# Patient Record
Sex: Male | Born: 1935 | Race: White | Hispanic: No | Marital: Married
Health system: Midwestern US, Academic
[De-identification: ages and names within clinical notes are randomized; demographics above are authoritative.]

---

## 2017-09-25 ENCOUNTER — Encounter: Admit: 2017-09-25 | Discharge: 2017-09-25 | Payer: MEDICARE

## 2017-09-25 ENCOUNTER — Ambulatory Visit: Admit: 2017-09-25 | Discharge: 2017-09-26 | Payer: MEDICARE

## 2017-09-25 DIAGNOSIS — E7800 Pure hypercholesterolemia, unspecified: ICD-10-CM

## 2017-09-25 DIAGNOSIS — I480 Paroxysmal atrial fibrillation: ICD-10-CM

## 2017-09-25 DIAGNOSIS — I4892 Unspecified atrial flutter: ICD-10-CM

## 2017-09-25 DIAGNOSIS — Z7901 Long term (current) use of anticoagulants: ICD-10-CM

## 2017-09-25 DIAGNOSIS — N200 Calculus of kidney: ICD-10-CM

## 2017-10-09 ENCOUNTER — Ambulatory Visit: Admit: 2017-10-09 | Discharge: 2017-10-10 | Payer: MEDICARE

## 2017-10-09 ENCOUNTER — Encounter: Admit: 2017-10-09 | Discharge: 2017-10-09 | Payer: MEDICARE

## 2017-10-09 DIAGNOSIS — I4892 Unspecified atrial flutter: Principal | ICD-10-CM

## 2017-10-09 DIAGNOSIS — E7800 Pure hypercholesterolemia, unspecified: ICD-10-CM

## 2017-10-09 DIAGNOSIS — I4891 Unspecified atrial fibrillation: ICD-10-CM

## 2017-10-23 ENCOUNTER — Encounter: Admit: 2017-10-23 | Discharge: 2017-10-23 | Payer: MEDICARE

## 2018-09-19 ENCOUNTER — Encounter: Admit: 2018-09-19 | Discharge: 2018-09-19

## 2018-09-19 NOTE — Progress Notes
Attempted to reach pt to change appointment times w/ CBP on 6.23.20 from 430 to 830 per Dr. Starleen Blue request. no answer. lmom w/ details and call back number if this does not work for him

## 2018-09-20 ENCOUNTER — Encounter: Admit: 2018-09-20 | Discharge: 2018-09-20

## 2018-09-24 ENCOUNTER — Ambulatory Visit: Admit: 2018-09-24 | Discharge: 2018-09-25

## 2018-09-24 ENCOUNTER — Encounter: Admit: 2018-09-24 | Discharge: 2018-09-24

## 2018-09-24 DIAGNOSIS — R296 Repeated falls: Secondary | ICD-10-CM

## 2018-09-24 DIAGNOSIS — N200 Calculus of kidney: Secondary | ICD-10-CM

## 2018-09-24 DIAGNOSIS — E7800 Pure hypercholesterolemia, unspecified: Secondary | ICD-10-CM

## 2018-09-24 DIAGNOSIS — I4892 Unspecified atrial flutter: Secondary | ICD-10-CM

## 2018-09-24 DIAGNOSIS — I480 Paroxysmal atrial fibrillation: Secondary | ICD-10-CM

## 2018-09-24 MED ORDER — METOPROLOL SUCCINATE 25 MG PO TB24
25 mg | ORAL_TABLET | Freq: Every evening | ORAL | 3 refills | 90.00000 days | Status: DC
Start: 2018-09-24 — End: 2019-09-23

## 2018-09-24 MED ORDER — METOPROLOL SUCCINATE 25 MG PO TB24
25 mg | ORAL_TABLET | Freq: Every evening | ORAL | 3 refills | 90.00000 days | Status: DC
Start: 2018-09-24 — End: 2018-09-24

## 2018-09-24 NOTE — Progress Notes
Date of Service: 09/24/2018    Juan Edwards is a 84 y.o. male.       HPI     Juan Edwards presents to CV med clinic today reporting his had 2 falls within the last several months leading to evaluation through nurse practitioner Juan Edwards.  He had a 48-hour Holter done through the hospital that showed some APCs but no pauses or conduction abnormalities with heart rate ranging between 47 and 87 with a mean of 55.  He did not have ventricular arrhythmias.  He presented to [facility] with this problem of falls on March 26.  He reported a fall when he was bending down to pick his pet his dog and fell over into the TV without loss of consciousness.  He did not have an aura just feeling of faintness before he fell.  The day he presented he fell a second time when he was out in the yard walking to catch up with his wife.  He felt faint then went to his knees and rolled to his back                                                              or head injury.  His wife said that Juan Edwards was with it the entire time.  Blood pressure record from before the event was consistently satisfactory to a slightly increased crease with pressures 1 20-1 60/60 to 80s.  Was also a history of black diarrhea without gross blood in the stool.  He got a colonoscopy which was benign.  His exam then was positive only for an abrasion to the right forearm.  EKG then showed sinus bradycardia rate 56 with slightly increased PR interval at 224 no change from priors..  There was note that he has not 2 beers and to distilled beverages daily with out tendency toward binge drinking.  There was concern this might be too much for him although he did not believe this was an issue.    In response to coaching from [place] he decided to not drink alcohol at all to see if that made any difference.  He is noted no change in anything about the way he ambulates although he has not fallen since then.  He has had a couple of beers recently with no since that he was more unsteady.       Vitals:    09/24/18 0816 09/24/18 0832   BP: (!) 142/82 (!) 146/84   BP Source: Arm, Left Upper Arm, Right Upper   Pulse: 60    SpO2: 97%    Weight: 91.3 kg (201 lb 3.2 oz)    Height: 1.88 m (6' 2)    PainSc: Zero      Body mass index is 25.83 kg/m???.     Past Medical History  Patient Active Problem List    Diagnosis Date Noted   ??? Sleep apnea with  CPAP  08/22/2016     Initial Rx ~2010.      ??? Isolated hypercholesterolemia 08/12/2015     01/2015 Total 190 Trig 55 HDL 73 LDL 97  08/2015 Start Lipitor 20 w/ 24% ten year risk reduced by 10% per ASCVD risk calculator.      ??? Atrial flutter with rapid ventricular response (HCC)  03/30/2015     03/30/15: Presents with EKG showing probabl atral flutter with carotid massage producing 2.2 second pause that reveals clear Flutter waves.      ??? Neurocardiogenic syncope 01/26/2015     Syncope Feb 04, 2009, Admit [facility]: Normal coronary arteriogram,  Tilt table>>No  syncope with a 20-minute tilt challenge.??? After nitroglycerin spray >>lightheadedness, dizziness, and syncope, predominantly vasodepressor element but also bradycardia with 2:1 block transiently.??? Imp: most likely neurocardiogenic syncope, Rx  increase aggressive fluid intake, avoid any triggers, [facility]      ??? On continuous oral anticoagulation 01/26/2015     Discontinued 02/04/15 when pt seems at low risk fo rrecurrent atrial fibrillation   Resumed 03/30/15 when atrial flutter with ventricular response 160 recurred.      ??? Paroxysmal atrial fibrillation (HCC) 12/01/2014     2012: [facility], Home on prn betablocker, no oral anticoagulation   05/2014: Atrial flutter on EKG [facility], spont. resolution, Rx Juan Edwards & pmetoprolol  12/01/14  pulse 140> [facility] ED> Atrial fibrillation  transfer [facility]: Tele>intermiitent AF & flutter. reg thall 8/31 EF 65%,normal perfusion, Rx Apixiban 5 BID 02/04/15 DC'd Apixiban 5 BID in  sinus rhythm  03/30/15: Pulse 164> EKG atrial flutter  RVR 2:1 conduction, converts to SR on flecainde p transfer [facility], Home on Eliquis 5BID Flecanide 5 BID.   04/13/15 NSR 60/min on outpt follow up Flec 50 BID Eliquis 5 BID.            Review of Systems   Constitution: Negative.   HENT: Negative.    Eyes: Negative.    Cardiovascular: Negative.    Respiratory: Negative.    Endocrine: Negative.    Hematologic/Lymphatic: Negative.    Skin: Negative.    Musculoskeletal: Positive for muscle cramps.   Gastrointestinal: Negative.    Genitourinary: Negative.    Neurological: Positive for loss of balance and numbness.   Psychiatric/Behavioral: Negative.    Allergic/Immunologic: Negative.    14 organ system review noted. It is negative except as reported in current narrative or above in the ROS section. This is a patient centered review of systems that was stated by the patient in his terms prior to my personal problem oriented interview with the patient     Physical Exam  General: Patient in no distress, looks generally healthy. Skin warm and dry, non icteric, Wearing a cloth face mask    Mucous membranes moist.  Pupils equal and round  Carotids: no bruits    Thyroid not enlarged.  Neck veins: CVP <6 normal, no V wave, no HJR     Respiratory: Breathing comfortably. Lungs clear to percussion & auscultation. No rales, rhonchi or wheezing   Cardiac: Regular rhythm. Normal S1 & S2  no rub or S3. No murmur  Abdomen: soft, non-tender, no masses,bruits,hepatic or aortic enlargement. + bowel sounds.   Femoral arteries: Good pulses, no bruits.  Legs/feet: Normal PT pulses, no edema.   Motor: Normal muscle strength. Cognitive: Pleasant demeanor. Good insight. No depression    Cardiovascular Studies  EKG today shows stable first-degree AV block is seen previously with 1 APC.  No evidence of infarct or ischemia.    Problems Addressed Today  No diagnosis found.    Assessment and Plan Juan Edwards's blood pressure today is a little higher than I would like but with the recent echo showing regression of LVH and his extensive evaluation earlier this year with no hypertension I think we  can leave things as they are.  He is concerned about medications triggering his falls so boosting his Lopressor from the current dose of 12.5 twice daily might cause more problems than it would resolve.    I do not think he is having any type of syncope based on the history that would be coming from intrinsic heart disease.  His echo showed no aortic stenosis and his exam today is benign.  I really think Juan Edwards is doing well from cardiac standpoint.  I am content to leave him on his current meds and see him back in a year for follow-up.  We will continue the apixaban for stroke prevention and the flecainide to a suppressed episodes of A. fib and flutter which have been recurrent.      Just in case the morning Lopressor is giving him more bradycardia than  is beneficial during the dayI am changing him to   metoprolol succinate/Toprol-XL 25 mg take at bedtime.  This leaves this total daily dose of metoprolol at 25 mg but allows him to take a single daily dose at bedtime.    Juan Edwards is going to use the metoprolol tartrate twice a day and then switch to the Toprol-XL.  In this way he can pay attention to how he is feeling on the twice a day drug and then make notes about how he feels on the once a day drug taken at bedtime.  His cardiac problems really are insubstantial so we have agreed to make his follow-up in 1year with his plan to call in if he has any issues that need earlier attention to Toprol-XL which he can take once daily at bedtime and the same dose for the day 25 mg which should give him less tendency for bradycardia when he is up and around during the day.      NB: The free text in this document was generated through Dragon(TM) software with editing and proofreading  done by the author of this document Dr. Mable Paris MD, [facility] principally at the point of care. Some errors may persist.  If there are questions about content in this document please contact Dr. Hale Bogus.    The written information I provided Juan Edwards at the conclusion of today's encounter is as  follows:    Patient Instructions   Donivan acute doing well overall.  It does not sound like falls came from heart meds were weak heart or bad rhythm.  Nevertheless switching to a once a day formulation of metoprolol that she take at bedtime may give you less tendency for slow heart during the day when you might need a faster heart rate.  I have sent a prescription to [facility] for Toprol-XL 25 mg at bedtime.  You could finish up the metoprolol tartrate/Lopressor that you have now.  Let let us know how you feel on the new drug if you do not feel as well but I think you may feel better on the once a day bedtime dose.    Check in with me in a year in the office.  If something changes before that I be happy to see you sooner.  Call in if you have problems or questions.   Marissa Nestle, MD     Your health and safety are our highest priorities. In the office today, you were noted to have a fall occur in the last 6 months. We want to do whatever we can to prevent this from happening again in the  future. Please be sure to use a walker or cane, if these were ordered for you, whenever you walk around at home and outside of your home.     I have included a copy of What You Can Do To Prevent Falls brochure. Please review this brochure and add as many recommendations as you can to your daily life. To protect you when you see [place] in the office, you should be accompanied by a staff member while walking throughout in our office. Please let us know if you have any questions or issues about this time by calling us at [phone].                    Current Medications (including today's revisions) ??? apixaban (ELIQUIS) 5 mg tablet Take 1 tablet by mouth twice daily.   ??? flecainide (TAMBOCOR) 50 mg tablet Take 1 tablet by mouth twice daily.   ??? metoprolol tartrate (LOPRESSOR) 25 mg tablet Take 0.5 tablets by mouth twice daily.   ??? omeprazole DR (PRILOSEC) 20 mg capsule Take 20 mg by mouth daily.

## 2019-03-11 ENCOUNTER — Encounter: Admit: 2019-03-11 | Discharge: 2019-03-11 | Payer: MEDICARE

## 2019-03-11 DIAGNOSIS — I480 Paroxysmal atrial fibrillation: Secondary | ICD-10-CM

## 2019-03-11 DIAGNOSIS — Z7901 Long term (current) use of anticoagulants: Secondary | ICD-10-CM

## 2019-03-11 DIAGNOSIS — I4892 Unspecified atrial flutter: Secondary | ICD-10-CM

## 2019-03-11 MED ORDER — FLECAINIDE 50 MG PO TAB
50 mg | ORAL_TABLET | Freq: Two times a day (BID) | ORAL | 3 refills | 30.00000 days | Status: DC
Start: 2019-03-11 — End: 2019-09-23

## 2019-05-07 ENCOUNTER — Encounter: Admit: 2019-05-07 | Discharge: 2019-05-07 | Payer: MEDICARE

## 2019-09-23 ENCOUNTER — Encounter: Admit: 2019-09-23 | Discharge: 2019-09-23 | Payer: MEDICARE

## 2019-09-23 DIAGNOSIS — R296 Repeated falls: Secondary | ICD-10-CM

## 2019-09-23 DIAGNOSIS — E7800 Pure hypercholesterolemia, unspecified: Secondary | ICD-10-CM

## 2019-09-23 DIAGNOSIS — I480 Paroxysmal atrial fibrillation: Secondary | ICD-10-CM

## 2019-09-23 DIAGNOSIS — I4892 Unspecified atrial flutter: Secondary | ICD-10-CM

## 2019-09-23 DIAGNOSIS — M791 Myalgia, unspecified site: Secondary | ICD-10-CM

## 2019-09-23 DIAGNOSIS — Z7901 Long term (current) use of anticoagulants: Secondary | ICD-10-CM

## 2019-09-23 DIAGNOSIS — N200 Calculus of kidney: Secondary | ICD-10-CM

## 2019-09-23 MED ORDER — METOPROLOL SUCCINATE 25 MG PO TB24
25 mg | ORAL_TABLET | Freq: Every evening | ORAL | 3 refills | 90.00000 days | Status: AC
Start: 2019-09-23 — End: ?

## 2019-09-23 MED ORDER — CETIRIZINE 5 MG PO TAB
5 mg | ORAL_TABLET | Freq: Every evening | ORAL | 3 refills | Status: DC
Start: 2019-09-23 — End: 2019-10-28

## 2019-09-23 MED ORDER — FLECAINIDE 50 MG PO TAB
50 mg | ORAL_TABLET | Freq: Two times a day (BID) | ORAL | 3 refills | 30.00000 days | Status: AC
Start: 2019-09-23 — End: ?

## 2019-09-23 MED ORDER — APIXABAN 5 MG PO TAB
5 mg | ORAL_TABLET | Freq: Two times a day (BID) | ORAL | 3 refills | Status: AC
Start: 2019-09-23 — End: ?

## 2019-09-23 NOTE — Progress Notes
Date of Service: 09/23/2019    Juan Edwards is a 85 y.o. male.       HPI     Juan Edwards for annual follow-up of his cardiovascular status that is principally been problematic because of atrial fibrillation and flutter.  He has states that he is doing well and does not have any new issues to discuss.  He has had no other major health problems arise since I saw him last.     Juan Edwards [place] and has a lot of animals to feed each day.  However before that he gets up and exercises about each morning doing stamina and resistance training.  He has not missed a day this year.  He has no cardiac symptoms.  He denies having typical symptoms suggesting the presence of angina, heart failure, TIA, claudication or arrhythmias.  He enjoys his exercise and has seen no decline in exercise capacity or new issues with breathing or chest discomfort since I last saw him.  On the whole he knows he feels much better when he is doing his exercises and when he has not.    He uses CPAP at night with good tolerance.  His wife told me that prior to CPAP his sleep study showed 77 episodes and is now down to 0.2 on CPAP.  Has been on CPAP several years with continued good response.  He has a little bit of issues with nasal congestion and requested some Zyrtec.     I started him on Lipitor in 2017 but he developed severe myalgias before he used his first prescription and he stopped it.  His lipid profile from earlier this year looks quite favorable for an 85 year old man with history of normal coronary arteries.   05/15/2019    Cholesterol 178   Triglycerides 52   HDL 71   LDL 97     It has been quite sometime since he had episodes of atrial fibrillation or flutter documented but is tolerating Eliquis well and is comfortable continuing this with the understanding that A. fib recurrences could lead to stroke without warning.       Vitals:    09/23/19 0826 09/23/19 0827   BP: 130/70 128/68   BP Source: Arm, Left Upper Arm, Right Upper   Patient Position: Sitting Sitting   Pulse: 52    SpO2: 96%    Weight: 93.4 kg (205 lb 12.8 oz)    Height: 1.88 m (6' 2)    PainSc: Zero      Body mass index is 26.42 kg/m?Marland Kitchen     Past Medical History  Patient Active Problem List    Diagnosis Date Noted   ? Recurrent falls while walking 09/24/2018     09/24/2018: Patient reports having had 2 falls while walking without loss of consciousness or dizziness in May 2020.  Evaluation at [facility] as outpatient completed including a heart monitor with no diagnostic findings.     ? Sleep apnea with  CPAP  08/22/2016     Initial Rx ~2010.      ? Isolated hypercholesterolemia 08/12/2015     01/2015 Total 190 Trig 55 HDL 73 LDL 97  08/2015 Start Lipitor 20 w/ 24% ten year risk reduced by 10% per ASCVD risk calculator.      ? Atrial flutter with rapid ventricular response (HCC) 03/30/2015     03/30/15: Presents with EKG showing probablr atral flutter with carotid massage producing 2.2 second pause that reveals clear  Flutter waves.      ? Neurocardiogenic syncope 01/26/2015     Syncope Feb 04, 2009, Admit [facility]: Normal coronary arteriogram,  Tilt table>>No  syncope with a 20-minute tilt challenge.? After nitroglycerin spray >>lightheadedness, dizziness, and syncope, predominantly vasodepressor element but also bradycardia with 2:1 block transiently.? Imp: most likely neurocardiogenic syncope, Rx  increase aggressive fluid intake, avoid any triggers, DrDendi [facility]      ? On continuous oral anticoagulation 01/26/2015     Discontinued 02/04/15 when pt seems at low risk fo rrecurrent atrial fibrillation   Resumed 03/30/15 when atrial flutter with ventricular response 160 recurred.      ? Paroxysmal atrial fibrillation (HCC) 12/01/2014     2012: [facility], Home on prn betablocker, no oral anticoagulation   05/2014: Atrial flutter on EKG [facility] ED, spont. resolution, Rx [facility] & metoprolol  12/01/14  pulse 140> [facility] ED> Atrial fibrillation  transfer [facility]: Tele>intermiitent AF & flutter. reg thall 8/31 EF 65%,normal perfusion, Rx Apixiban 5 BID  02/04/15 DC'd Apixiban 5 BID in  sinus rhythm  03/30/15: Pulse 164> EKG atrial flutter  RVR 2:1 conduction, converts to SR on flecainde p transfer [facility], Home on Eliquis 5BID Flecanide 5 BID.   04/13/15 NSR 60/min on outpt follow up Flec 50 BID Eliquis 5 BID.   09/2018 sinus rhythm with 1 APC on flecainide 50 twice daily Lopressor 25 twice daily and Eliquis 5 twice daily in [facility]           Review of Systems   Constitution: Negative.   HENT: Negative.    Eyes: Negative.    Cardiovascular: Negative.    Respiratory: Negative.    Endocrine: Negative.    Hematologic/Lymphatic: Negative.    Skin: Negative.    Musculoskeletal: Negative.    Gastrointestinal: Positive for diarrhea.   Genitourinary: Negative.    Neurological: Negative.    Psychiatric/Behavioral: Negative.    Allergic/Immunologic: Negative.    14 organ system review noted. It is negative except as reported in current narrative or above in the ROS section. This is a patient centered review of systems that was stated by the patient in his terms prior to my personal problem oriented interview with the patient     Physical Exam  General: Patient in no distress, looks generally healthy. Skin warm and dry, non icteric,Wearing medical face mask    Mucous membranes moist.  Pupils equal and round  Carotids: no bruits    Thyroid not enlarged.  Neck veins: CVP <6 normal, no V wave, no HJR     Respiratory: Breathing comfortably. Lungs clear to percussion & auscultation. No rales, rhonchi or wheezing   Cardiac: Regular rhythm. Normal S1 & S2  no rub or S3. No murmur  Abdomen: soft, non-tender, no masses,bruits,hepatic or aortic enlargement. + bowel sounds.   Femoral arteries: Good pulses, no bruits.  Legs/feet: Normal PT pulses, no edema.   Motor: Normal muscle strength. Cognitive: Pleasant demeanor. Good insight. No depression Cardiovascular Studies  Today's 12 lead EKG: sinus bradycardia, rate 59. First degree AV block, PR interval (msec) is  220     Problems Addressed Today  No diagnosis found.    Assessment and Plan     Roger is doing extremely well.  He has excellent exercise program with good exercise capacity and no cardiopulmonary symptoms.  He had fallen a couple of times in early 2020 with outpatient evaluation and no recurrence.  Is a lipid profile looks favorable without medications and does not need to be considered for statin therapy particular since he had severe myalgias with low-dose Lipitor in 2017.    His blood pressure looks good.    He is tolerating his Eliquis well at the appropriate dose for his weight at age and renal function.  He is on well-tolerated dose of flecainide to suppress A. fib/flutter burden with no adverse effects.    He does not need any surveillance testing today.  Annual follow-up is all he needs.  I recommended that he follow-up with Dr. Feliz Beam love or Dr. Baldo Ash or a younger colleagues who will be covering [place] and can provide him good care after I stop coming to [place] at the end of this year.      NB: The free text in this document was generated through Dragon(TM) software with editing and proofreading  done by the author of this document Dr. Mable Paris MD, [facility] principally at the point of care. Some errors may persist.  If there are questions about content in this document please contact Dr. Hale Bogus.    The written information I provided Mr. Kahler at the conclusion of today's encounter is as  follows:    Patient Instructions   Nabeel is we have just discussed you are doing tremendously well.  Your blood pressure looks good your heart rhythm is good you are taking your medications to prevent stroke and keep your rhythm it and heart rate satisfactory with good results.    There is no particular age for changing doses of Toprol but I am glad the pharmacy cut your back to your current dose.  Your heart rates around 60 which is fine.    There is no reason for you to start or stop any of your drugs.  Everything is going well.  Your cholesterol without a Lipitor looked fine earlier this month so no worries about restarting a statin.    Stick with your exercise program and keep your meds.    Next year when you are due for a visit we will set you up with either Dr. Baldo Ash or Lissa Morales as the new younger docs coming up to [place].  They both enjoy coming up.  I think will be here for a long time although there are based in [place] most of the time.  It's been a pleasure taking care of you.     Ccall in if you have problems or questions.   Marissa Nestle, MD              Current Medications (including today's revisions)  ? apixaban (ELIQUIS) 5 mg tablet Take 1 tablet by mouth twice daily.   ? flecainide (TAMBOCOR) 50 mg tablet Take one tablet by mouth twice daily.   ? metoprolol XL (TOPROL XL) 25 mg extended release tablet Take one tablet by mouth at bedtime daily.   ? omeprazole DR (PRILOSEC) 20 mg capsule Take 20 mg by mouth daily.

## 2019-09-23 NOTE — Patient Instructions
Juan Edwards is we have just discussed you are doing tremendously well.  Your blood pressure looks good your heart rhythm is good you are taking your medications to prevent stroke and keep your rhythm it and heart rate satisfactory with good results.    There is no particular age for changing doses of Toprol but I am glad the pharmacy cut your back to your current dose.  Your heart rates around 60 which is fine.    There is no reason for you to start or stop any of your drugs.  Everything is going well.  Your cholesterol without a Lipitor looked fine earlier this month so no worries about restarting a statin.    Stick with your exercise program and keep your meds.    Next year when you are due for a visit we will set you up with either Dr. Baldo Ash or Lissa Morales as the new younger docs coming up to [place].  They both enjoy coming up.  I think will be here for a long time although there are based in [place] most of the time.  It's been a pleasure taking care of you.     Ccall in if you have problems or questions.   Marissa Nestle, MD

## 2019-10-28 ENCOUNTER — Encounter: Admit: 2019-10-28 | Discharge: 2019-10-28 | Payer: MEDICARE

## 2019-10-28 MED ORDER — CETIRIZINE 10 MG PO TAB
10 mg | ORAL_TABLET | Freq: Every evening | ORAL | 3 refills | Status: AC
Start: 2019-10-28 — End: ?

## 2020-03-22 ENCOUNTER — Encounter: Admit: 2020-03-22 | Discharge: 2020-03-22 | Payer: MEDICARE

## 2020-03-22 DIAGNOSIS — Z7901 Long term (current) use of anticoagulants: Secondary | ICD-10-CM

## 2020-03-22 DIAGNOSIS — I480 Paroxysmal atrial fibrillation: Secondary | ICD-10-CM

## 2020-03-22 DIAGNOSIS — I4892 Unspecified atrial flutter: Secondary | ICD-10-CM

## 2020-03-22 MED ORDER — FLECAINIDE 50 MG PO TAB
50 mg | ORAL_TABLET | Freq: Two times a day (BID) | ORAL | 3 refills | 30.00000 days | Status: AC
Start: 2020-03-22 — End: ?

## 2020-03-23 ENCOUNTER — Encounter: Admit: 2020-03-23 | Discharge: 2020-03-23 | Payer: MEDICARE

## 2020-03-23 DIAGNOSIS — I4892 Unspecified atrial flutter: Secondary | ICD-10-CM

## 2020-03-23 DIAGNOSIS — I480 Paroxysmal atrial fibrillation: Secondary | ICD-10-CM

## 2020-03-23 DIAGNOSIS — Z7901 Long term (current) use of anticoagulants: Secondary | ICD-10-CM

## 2020-09-07 ENCOUNTER — Encounter: Admit: 2020-09-07 | Discharge: 2020-09-07 | Payer: MEDICARE

## 2020-09-07 DIAGNOSIS — I480 Paroxysmal atrial fibrillation: Principal | ICD-10-CM

## 2020-09-07 DIAGNOSIS — I4892 Unspecified atrial flutter: Secondary | ICD-10-CM

## 2020-09-07 DIAGNOSIS — Z7901 Long term (current) use of anticoagulants: Secondary | ICD-10-CM

## 2020-09-07 MED ORDER — CETIRIZINE 10 MG PO TAB
10 mg | ORAL_TABLET | Freq: Every evening | ORAL | 3 refills | Status: AC
Start: 2020-09-07 — End: ?

## 2020-09-07 MED ORDER — FLECAINIDE 50 MG PO TAB
50 mg | ORAL_TABLET | Freq: Two times a day (BID) | ORAL | 3 refills | 30.00000 days | Status: AC
Start: 2020-09-07 — End: ?

## 2020-09-07 MED ORDER — APIXABAN 5 MG PO TAB
5 mg | ORAL_TABLET | Freq: Two times a day (BID) | ORAL | 3 refills | Status: AC
Start: 2020-09-07 — End: ?

## 2020-09-07 MED ORDER — METOPROLOL SUCCINATE 25 MG PO TB24
25 mg | ORAL_TABLET | Freq: Every evening | ORAL | 3 refills | 90.00000 days | Status: AC
Start: 2020-09-07 — End: ?

## 2020-09-30 ENCOUNTER — Encounter: Admit: 2020-09-30 | Discharge: 2020-09-30 | Payer: MEDICARE

## 2020-10-12 ENCOUNTER — Encounter: Admit: 2020-10-12 | Discharge: 2020-10-12 | Payer: MEDICARE

## 2020-10-12 DIAGNOSIS — I4892 Unspecified atrial flutter: Secondary | ICD-10-CM

## 2020-10-12 DIAGNOSIS — N200 Calculus of kidney: Secondary | ICD-10-CM

## 2020-10-12 DIAGNOSIS — E7800 Pure hypercholesterolemia, unspecified: Secondary | ICD-10-CM

## 2020-10-12 DIAGNOSIS — I480 Paroxysmal atrial fibrillation: Secondary | ICD-10-CM

## 2020-10-12 NOTE — Patient Instructions
Follow up as directed.  Call sooner if issues.  Call the [facility] nursing line at [phone].  Leave a detailed message for the nurse in [facility] with how we can assist you and we will call you back.

## 2021-10-10 ENCOUNTER — Encounter: Admit: 2021-10-10 | Discharge: 2021-10-10 | Payer: MEDICARE

## 2021-10-10 DIAGNOSIS — I480 Paroxysmal atrial fibrillation: Principal | ICD-10-CM

## 2021-10-10 DIAGNOSIS — I4892 Unspecified atrial flutter: Secondary | ICD-10-CM

## 2021-10-10 DIAGNOSIS — Z7901 Long term (current) use of anticoagulants: Secondary | ICD-10-CM

## 2021-10-10 MED ORDER — METOPROLOL SUCCINATE 25 MG PO TB24
25 mg | ORAL_TABLET | Freq: Every evening | ORAL | 3 refills | 90.00000 days | Status: AC
Start: 2021-10-10 — End: ?

## 2021-10-10 MED ORDER — APIXABAN 5 MG PO TAB
5 mg | ORAL_TABLET | Freq: Two times a day (BID) | ORAL | 3 refills | Status: DC
Start: 2021-10-10 — End: 2021-10-10

## 2021-10-10 MED ORDER — FLECAINIDE 50 MG PO TAB
50 mg | ORAL_TABLET | Freq: Two times a day (BID) | ORAL | 3 refills | 30.00000 days | Status: DC
Start: 2021-10-10 — End: 2021-10-10

## 2021-10-10 MED ORDER — FLECAINIDE 50 MG PO TAB
50 mg | ORAL_TABLET | Freq: Two times a day (BID) | ORAL | 0 refills | 30.00000 days | Status: AC
Start: 2021-10-10 — End: ?

## 2021-10-10 MED ORDER — METOPROLOL SUCCINATE 25 MG PO TB24
25 mg | ORAL_TABLET | Freq: Every evening | ORAL | 3 refills | 90.00000 days | Status: DC
Start: 2021-10-10 — End: 2021-10-10

## 2021-10-10 MED ORDER — APIXABAN 5 MG PO TAB
5 mg | ORAL_TABLET | Freq: Two times a day (BID) | ORAL | 0 refills | Status: AC
Start: 2021-10-10 — End: ?

## 2021-10-21 ENCOUNTER — Encounter: Admit: 2021-10-21 | Discharge: 2021-10-21 | Payer: MEDICARE

## 2021-10-21 DIAGNOSIS — I4892 Unspecified atrial flutter: Secondary | ICD-10-CM

## 2021-10-21 DIAGNOSIS — I480 Paroxysmal atrial fibrillation: Secondary | ICD-10-CM

## 2021-10-21 LAB — COMPREHENSIVE METABOLIC PANEL
ALBUMIN: 4.2
ALK PHOSPHATASE: 46
ALT: 14
ANION GAP: 6 — ABNORMAL LOW (ref 8–16)
AST: 20
BLD UREA NITROGEN: 15
CALCIUM: 9.5
CHLORIDE: 106
CO2: 24
GFR ESTIMATED: 52 — ABNORMAL LOW (ref >59)
GLUCOSE,PANEL: 94
POTASSIUM: 4.6
SODIUM: 136
TOTAL BILIRUBIN: 1
TOTAL PROTEIN: 7

## 2021-10-21 LAB — MAGNESIUM: MAGNESIUM: 2.1 — ABNORMAL HIGH (ref 0.72–1.25)

## 2021-11-14 ENCOUNTER — Encounter: Admit: 2021-11-14 | Discharge: 2021-11-14 | Payer: MEDICARE

## 2021-11-23 ENCOUNTER — Encounter: Admit: 2021-11-23 | Discharge: 2021-11-23 | Payer: MEDICARE

## 2021-11-29 ENCOUNTER — Encounter: Admit: 2021-11-29 | Discharge: 2021-11-29 | Payer: MEDICARE

## 2021-11-29 DIAGNOSIS — N200 Calculus of kidney: Secondary | ICD-10-CM

## 2021-11-29 DIAGNOSIS — E7800 Pure hypercholesterolemia, unspecified: Secondary | ICD-10-CM

## 2021-11-29 DIAGNOSIS — I4892 Unspecified atrial flutter: Secondary | ICD-10-CM

## 2021-11-29 DIAGNOSIS — R0989 Other specified symptoms and signs involving the circulatory and respiratory systems: Secondary | ICD-10-CM

## 2021-11-29 DIAGNOSIS — I480 Paroxysmal atrial fibrillation: Secondary | ICD-10-CM

## 2022-01-04 ENCOUNTER — Encounter: Admit: 2022-01-04 | Discharge: 2022-01-04 | Payer: MEDICARE

## 2022-01-04 NOTE — Telephone Encounter
Received a call from [facility] with Dr. Janeann Merl office requesting approval from Dr. Erling Cruz for patient to hold Eliquis for 2 days prior to his Colonoscopy on 01/18/22. Patient is on Eliquis for paroxysmal atrial fibrillation and he is a CHADSVASC of  2 for age and gender.       Will route to [facility] for recommendations.      [phone]

## 2022-10-10 ENCOUNTER — Encounter: Admit: 2022-10-10 | Discharge: 2022-10-10 | Payer: MEDICARE

## 2022-10-10 DIAGNOSIS — I4892 Unspecified atrial flutter: Secondary | ICD-10-CM

## 2022-10-10 DIAGNOSIS — Z7901 Long term (current) use of anticoagulants: Secondary | ICD-10-CM

## 2022-10-10 DIAGNOSIS — I480 Paroxysmal atrial fibrillation: Secondary | ICD-10-CM

## 2022-10-10 MED ORDER — APIXABAN 5 MG PO TAB
5 mg | ORAL_TABLET | Freq: Two times a day (BID) | ORAL | 0 refills | Status: AC
Start: 2022-10-10 — End: ?

## 2022-10-10 MED ORDER — METOPROLOL SUCCINATE 25 MG PO TB24
25 mg | ORAL_TABLET | Freq: Every evening | ORAL | 0 refills | 90.00000 days | Status: AC
Start: 2022-10-10 — End: ?

## 2022-10-10 MED ORDER — FLECAINIDE 50 MG PO TAB
50 mg | ORAL_TABLET | Freq: Two times a day (BID) | ORAL | 0 refills | 30.00000 days | Status: AC
Start: 2022-10-10 — End: ?

## 2022-12-25 ENCOUNTER — Encounter: Admit: 2022-12-25 | Discharge: 2022-12-25 | Payer: MEDICARE

## 2022-12-26 ENCOUNTER — Encounter: Admit: 2022-12-26 | Discharge: 2022-12-26 | Payer: MEDICARE

## 2023-01-02 ENCOUNTER — Encounter: Admit: 2023-01-02 | Discharge: 2023-01-02 | Payer: MEDICARE

## 2023-01-02 DIAGNOSIS — R0989 Other specified symptoms and signs involving the circulatory and respiratory systems: Secondary | ICD-10-CM

## 2023-01-02 DIAGNOSIS — Z79899 Other long term (current) drug therapy: Secondary | ICD-10-CM

## 2023-01-02 DIAGNOSIS — I480 Paroxysmal atrial fibrillation: Secondary | ICD-10-CM

## 2023-01-02 DIAGNOSIS — I4892 Unspecified atrial flutter: Secondary | ICD-10-CM

## 2023-01-02 DIAGNOSIS — E7800 Pure hypercholesterolemia, unspecified: Secondary | ICD-10-CM

## 2023-01-02 DIAGNOSIS — N200 Calculus of kidney: Secondary | ICD-10-CM

## 2023-01-02 NOTE — Patient Instructions
Thank you for visiting our office today.    Continue the same medications as you have been doing.          We will be pursuing the following tests after your appointment today:    Stress test- the order will be sent to [facility] scheduling and they will contact you to schedule this test.    We will plan to see you back in 1 year.  Please call us in the meantime with any questions or concerns.        Please allow 5-7 business days for our providers to review your results. All normal results will go to MyChart. If you do not have Mychart, it is strongly recommended to get this so you can easily view all your results. If you do not have mychart, we will attempt to call you once with normal lab and testing results. If we cannot reach you by phone with normal results, we will send you a letter.  If you have not heard the results of your testing after one week please give [place] a call.       Your Cardiovascular Medicine [place] Team Brett Canales, Pilar Jarvis and [place])  phone number is [phone].

## 2023-01-02 NOTE — Progress Notes
Date of Service: 01/02/2023    Juan Edwards is a 88 y.o. male.       Chief Complaint: Follow-up    History of Present Illness:     I had the pleasure of seeing Juan Edwards in our [place] office this morning for cardiovascular followup.      As you know, Juan Edwards is a delightful 88 year old gentleman who has previously followed with Bland Span, MD.  He has a history of dyslipidemia with statin intolerance due to myalgias, sleep apnea on CPAP, and paroxysmal atrial fibrillation, which has been well controlled on flecainide 50 mg b.i.d. and apixaban 5 mg b.i.d.      Since our last visit Juan Edwards has gotten along pretty well.  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  They go out once a week on Wednesdays to gamble at a local casino.    From a symptom standpoint he has no concerns today.  He has not had any further issues of loss of consciousness over the last year that he can recall.  In the past these episodes been felt to be related to low sodium.  Since starting sodium tablets he has done better.  He denies chest pain or shortness of breath.  No lightheadedness, dizziness, palpitations, heart racing, orthopnea, paroxysmal nocturnal dyspnea, or lower extremity swelling.       Juan Edwards's most recent cardiovascular testing includes a resting transthoracic echocardiogram from October 10, 2017.  This demonstrated normal left ventricular systolic function, EF 60% to 65%.  Normal right ventricular size and systolic function and no significant valvular disease.  His pulmonary artery pressure was estimated at 34 mmHg.  He had a stress myocardial perfusion study on December 02, 2014.  This did not demonstrate any evidence of myocardial ischemia.  Left ventricular systolic function was normal.        Past Medical History:  Patient Active Problem List    Diagnosis Date Noted    Myalgia due to statin 09/23/2019     2017 Intolerant of starting dose Lipitor  stopped before first prescription completed.  muscle symptoms resolved promptly      Recurrent falls while walking 09/24/2018     09/24/2018: Patient reports having had 2 falls in March 2020 while walking without loss of consciousness or dizziness.  Evaluation at [facility] as outpatient completed including a heart monitor with no diagnostic findings.      Sleep apnea with  CPAP  08/22/2016     Initial Rx ~2010.       Isolated hypercholesterolemia 08/12/2015     01/2015 Total 190 Trig 55 HDL 73 LDL 97  08/2015 Start Lipitor 20 w/ 24% ten year risk reduced by 10% per ASCVD risk calculator.  Discontinued after 1 month due to severe myalgias.  05/15/2019 total cholesterol 178 trig 52, HDL 71 and LDL 97 without statin does not warrant resumption of statin        Atrial flutter with rapid ventricular response (HCC) 03/30/2015     03/30/15: Presents with EKG showing probablr atral flutter with carotid massage producing 2.2 second pause that reveals clear Flutter waves.       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  After nitroglycerin spray >>lightheadedness, dizziness, and syncope, predominantly vasodepressor element but also bradycardia with 2:1 block transiently.  Imp:  most likely neurocardiogenic syncope, Rx  increase aggressive fluid intake, [facility] any triggers[facility]       On continuous oral anticoagulation 01/26/2015     Discontinued 02/04/15 when pt seems at low risk fo rrecurrent atrial fibrillation   Resumed 03/30/15 when atrial flutter with ventricular response 160 recurred.       Paroxysmal atrial fibrillation (HCC) 12/01/2014     2012: [facility] recalls [facility], Home on prn betablocker, no oral anticoagulation   05/2014: Atrial flutter on EKG [facility], spont. resolution, Rx Asa & metoprolol  12/01/14  pulse 140> [facility] ED> Atrial fibrillation  transfer [facility]: Tele>intermiitent AF & flutter. reg thall 8/31 EF 65%,normal perfusion, Rx Apixiban 5 BID  02/04/15 DC'd Apixiban 5 BID in  sinus rhythm  03/30/15: Pulse 164> EKG atrial flutter  RVR 2:1 conduction, converts to SR on flecainde p transfer [facility], Home on Eliquis 5BID Flecanide 5 BID.   04/13/15 NSR 60/min on outpt follow up Flec 50 BID Eliquis 5 BID.   09/2018 sinus rhythm with 1 APC on flecainide 50 twice daily Lopressor 25 twice daily and Eliquis 5 twice daily in [place] [facility]         Review of Systems   Constitutional: Negative.   HENT: Negative.     Eyes: Negative.    Cardiovascular: Negative.    Respiratory: Negative.     Endocrine: Negative.    Hematologic/Lymphatic: Negative.    Skin: Negative.    Musculoskeletal: Negative.    Gastrointestinal: Negative.    Genitourinary: Negative.    Neurological: Negative.    Psychiatric/Behavioral: Negative.     Allergic/Immunologic: Negative.        Vitals:    01/02/23 0723   BP: 124/78   BP Source: Arm, Left Upper   Pulse: 54   SpO2: 100%   PainSc: Zero   Weight: 89.8 kg (198 lb)   Height: 188 cm (6' 2)     Body mass index is 25.42 kg/m?Marland Kitchen    Physical Examination:  General Appearance: No acute distress. Fully alert and oriented.  Skin: Warm. No ulcers or xanthomas.   HEENT: Grossly unremarkable. Lips and oral mucosa without pallor or cyanosis. Moist mucous membranes.   Neck Veins: Normal jugular venous pressure. Neck veins are not distended.  Carotid Arteries: Normal carotid upstroke bilaterally. No bruits.  Chest Inspection:  Chest is normal in appearance.  Auscultation/Percussion: Normal respiratory effort. Lungs clear to auscultation bilaterally. No wheezes, rales, or rhonchi.    Cardiac Rhythm: Regular rhythm. Normal rate.  Cardiac Auscultation: Normal S1 & S2. No S3 or S4. No rub.  Murmurs: No cardiac murmurs.  Peripheral Circulation: Normal peripheral circulation.   Abdominal Aorta: No abdominal aortic bruit.  Extremities: Appropriately warm to touch. No lower extremity edema.  Abdominal Exam: Soft, non-tender. No masses, no organomegaly. Normal bowel sounds.  Neurologic Exam: Neurological assessment grossly intact.       Assessment and Plan:  1.  Episodes of loss of consciousness: It sounds like this has been felt to be related to low sodium.  He is now on salt tablets and feels better.  I think there are some question of whether he has had neurocardiogenic syncope in the past as well.  We discussed last time that he takes flecainide 50 mg twice daily and it has been several years since his last ischemic evaluation.  Had previously recommended stress testing, but they politely declined.  Today [facility] is interested in  moving forward.  Will arrange for regadenoson myocardial perfusion study here at [facility] in the coming weeks.     2.  Paroxysmal atrial fibrillation: He has had paroxysmal atrial fibrillation over the years.  He is well controlled on flecainide 50 mg twice daily.  He is also taking metoprolol 25 mg daily.  Anticoagulation with apixaban 5 mg twice daily.  Given his weight and renal function I think this is appropriate.  As above, we did discuss ischemic evaluation in the setting of flecainide use.  He is agreeable to moving forward with stress testing.  Will arrange.     3.  Dyslipidemia: He has had an elevated ASCVD risk in the past and was started on atorvastatin.  Unfortunately developed severe myalgias.  He is now off all statin medications.  We talked briefly about ezetimibe but he politely declined.  We will continue to monitor.     4.  Obstructive sleep apnea: He reports good compliance with CPAP.     I really enjoyed seeing Juan Edwards in the office today and I appreciate the opportunity to take part in his care.  I look forward to seeing him back in about a year.  If I can be of any assistance in the interim please not hesitate to reach out questions or concerns.         Total time spent on today's office visit was 35 minutes. This includes face-to-face in person visit with patient as well as non face-to-face time including review of the electronic medical record, outside records, labs, radiologic studies, cardiovascular studies, formulation of treatment plan, after visit summary, future disposition, personal discussions, and documentation.    Current Medications (including today's revisions)   apixaban (ELIQUIS) 5 mg tablet Take one tablet by mouth twice daily. Need lab for future refills    cetirizine (ZYRTEC) 10 mg tablet Take one tablet by mouth at bedtime daily.    flecainide (TAMBOCOR) 50 mg tablet Take one tablet by mouth twice daily. Need lab  For future refills    metoprolol succinate XL (TOPROL XL) 25 mg extended release tablet Take one tablet by mouth at bedtime daily.    sodium chloride 1 gram tablet Take one tablet by mouth daily.

## 2023-01-04 ENCOUNTER — Ambulatory Visit: Admit: 2023-01-04 | Discharge: 2023-01-04 | Payer: MEDICARE

## 2023-01-04 ENCOUNTER — Encounter: Admit: 2023-01-04 | Discharge: 2023-01-04 | Payer: MEDICARE

## 2023-01-04 DIAGNOSIS — Z79899 Other long term (current) drug therapy: Secondary | ICD-10-CM

## 2023-01-04 DIAGNOSIS — I480 Paroxysmal atrial fibrillation: Secondary | ICD-10-CM

## 2023-01-04 DIAGNOSIS — R0989 Other specified symptoms and signs involving the circulatory and respiratory systems: Secondary | ICD-10-CM

## 2023-02-19 ENCOUNTER — Encounter: Admit: 2023-02-19 | Discharge: 2023-02-19 | Payer: MEDICARE

## 2023-02-19 NOTE — Telephone Encounter
-----   Message from Loura Halt, MD sent at 02/19/2023  2:07 PM CST -----  Please let Juan Edwards know that I reviewed his images.  There was some concern that he may have heart injury from blocked heart artery on the stress test, but on my review I did not see anything definitive.  I think this was likely artifactual.  As such I think we can continue flecainide for now.    Thanks!

## 2023-02-19 NOTE — Telephone Encounter
Left VM with results and recommendations.  Left callback number for any questions or concerns.

## 2023-03-20 IMAGING — CT BRAIN WO(Adult)
3 of 4 series · 14 of 47 positions shown, 16 images · non-contrast
Comparison: none

[Series 4: brain cor 5.00 hr40 s3 · coronal · 0.33mm/px · 3 of 44 slices shown]
[im 15/44  brain]
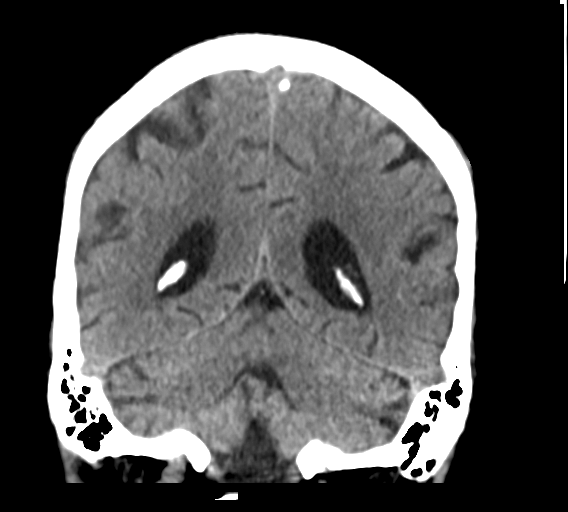
[im 20/44  brain]
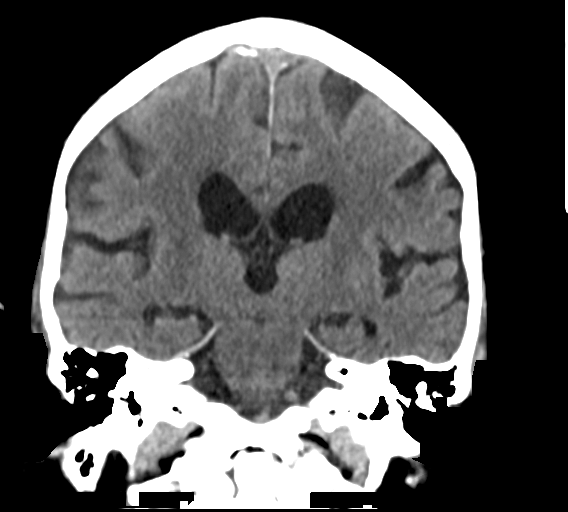
[im 24/44  brain]
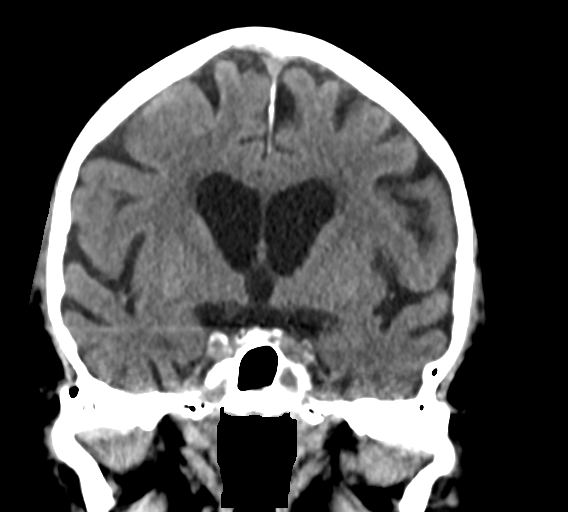

[Series 6: brain sag 5.00 hr40 s3 · sagittal · 0.33mm/px · 3 of 37 slices shown]
[im 13/37  brain]
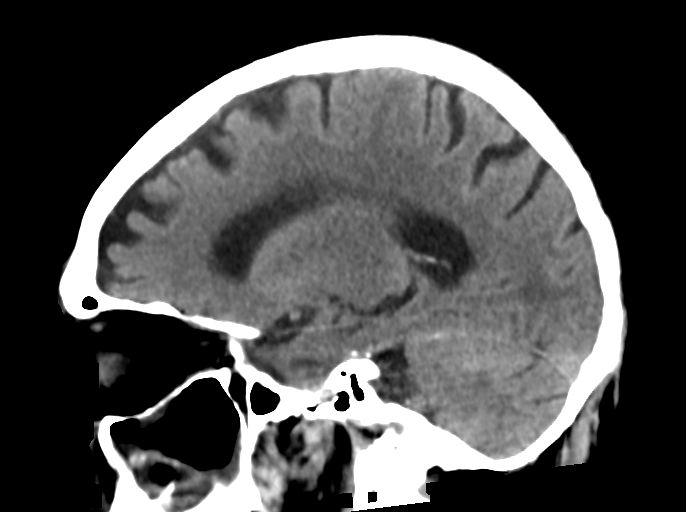
[im 19/37  brain]
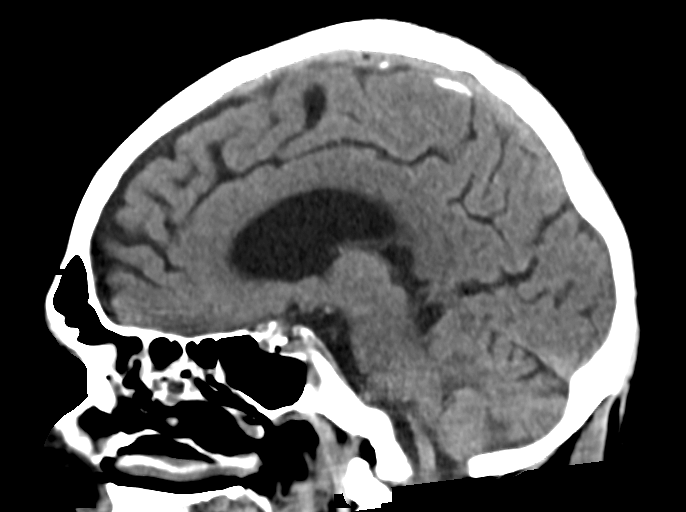
[im 25/37  brain]
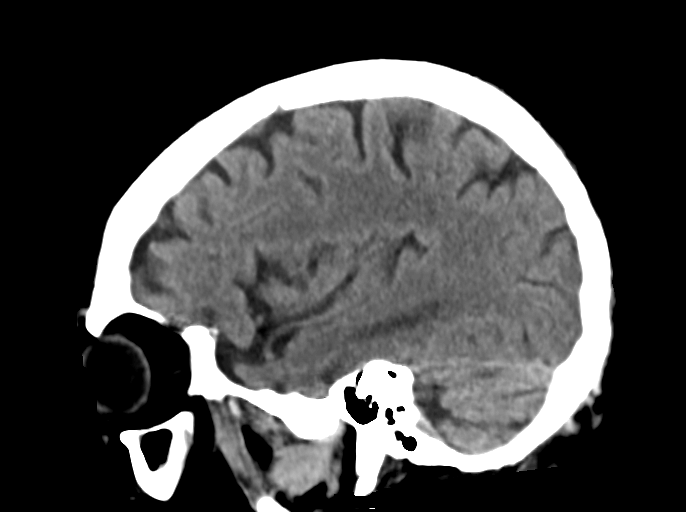

[Series 8: brain ax 2.00 hr60 s3 · axial · 0.39mm/px · z∈[-549,-420]mm · 8 of 83 slices shown, 10 images]
[im 9/83  brain]
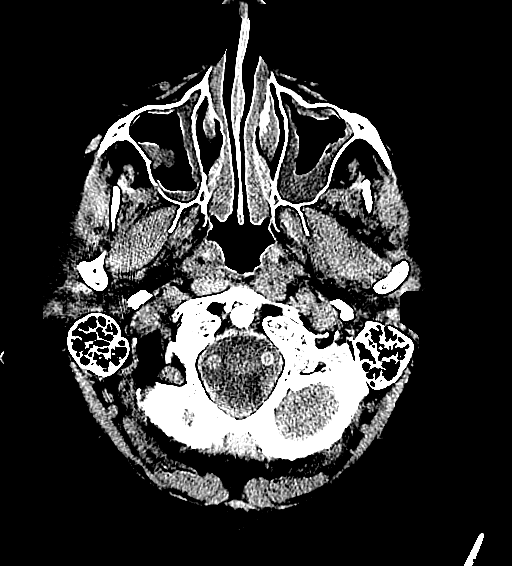
[im 9/83  bone]
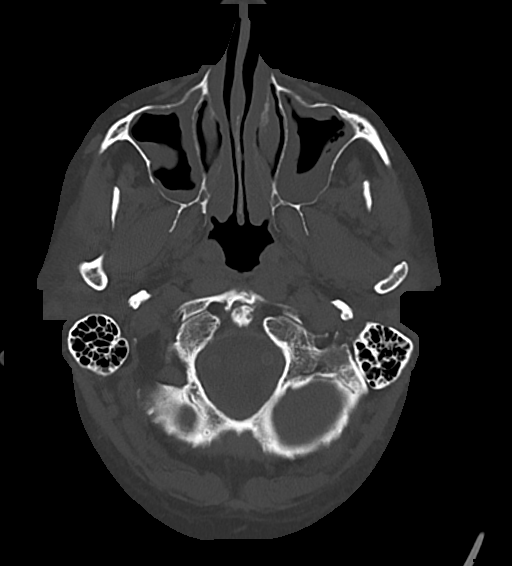
[im 17/83  brain]
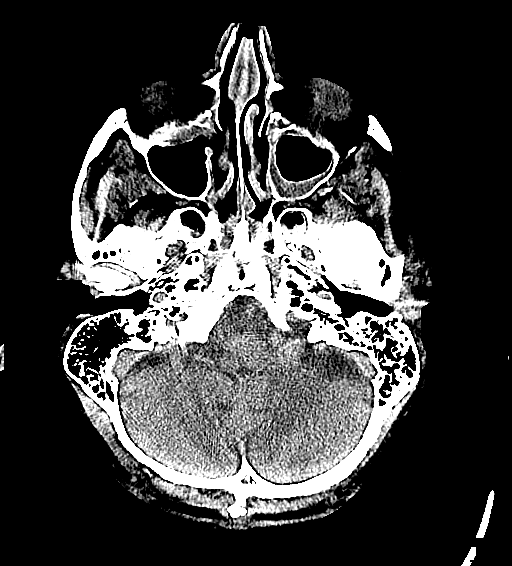
[im 25/83  brain]
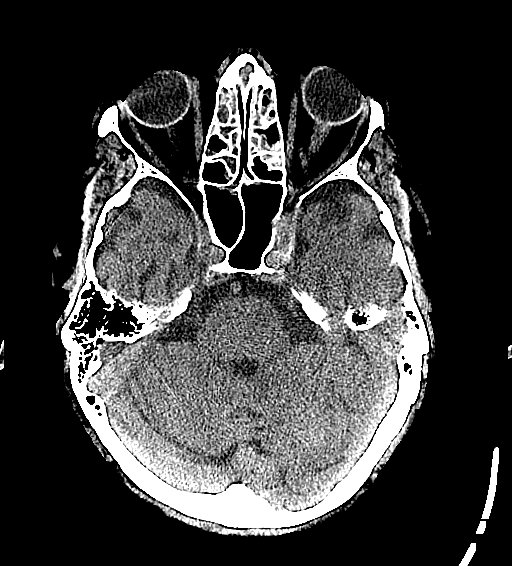
[im 37/83  brain]
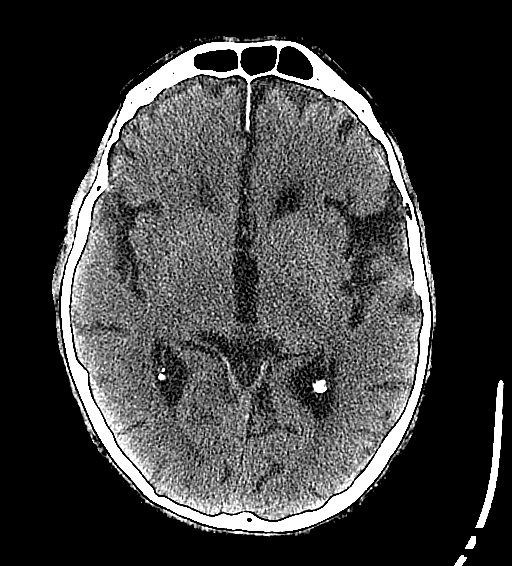
[im 46/83  brain]
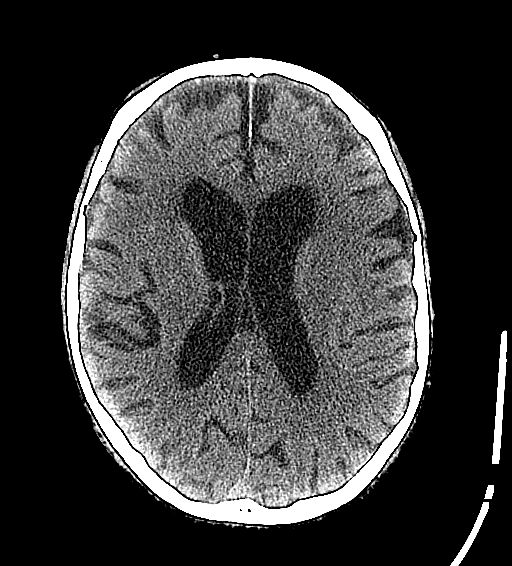
[im 46/83  bone]
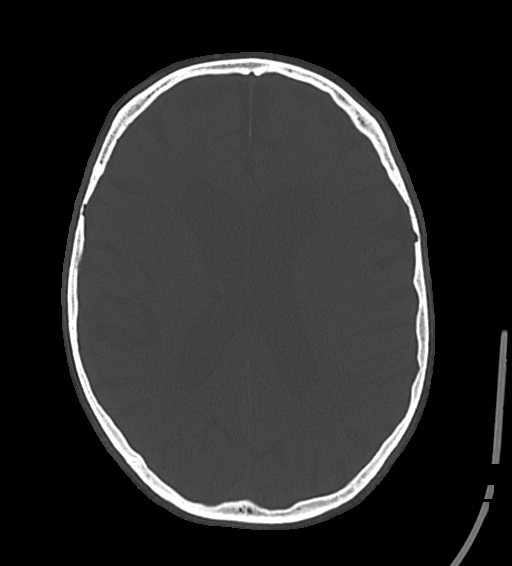
[im 58/83  brain]
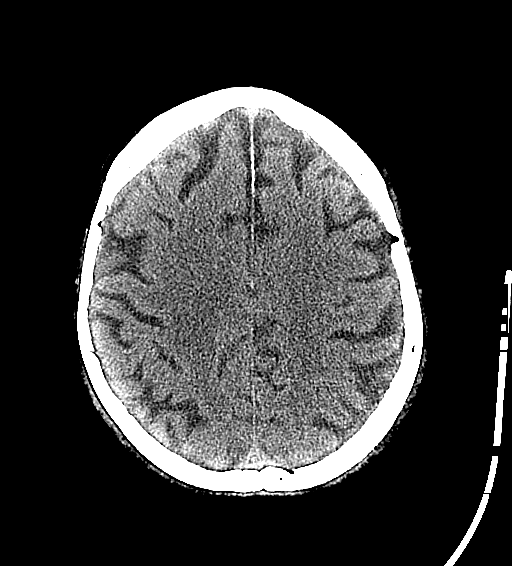
[im 66/83  brain]
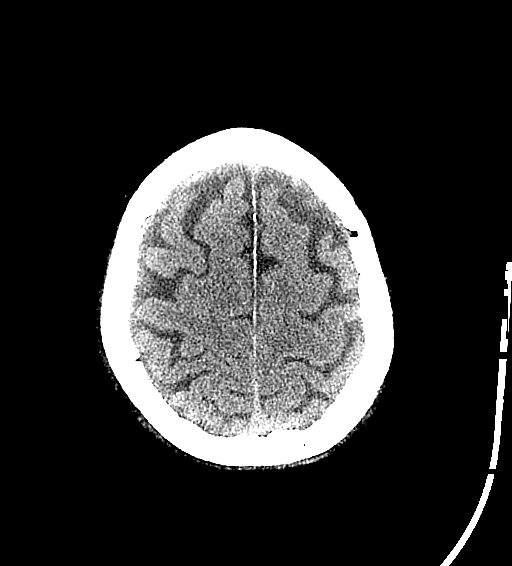
[im 74/83  brain]
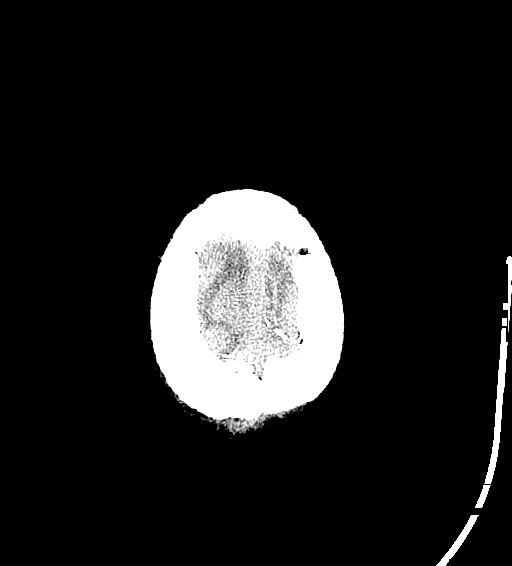

[14 of 47 positions shown; findings below may reference images not displayed]

DIAGNOSTIC STUDIES

EXAM

CT scan of the head without contrast.

INDICATION

confusional state
PT STATES AMS THIS MORNING. CT/NM 0/0. TJ

TECHNIQUE

All CT scans at this facility use dose modulation, iterative reconstruction, and/or weight based
dosing when appropriate to reduce radiation dose to as low as reasonably achievable.

Number of previous computed tomography exams in the last 12 months is 0 .

Number of previous nuclear medicine myocardial perfusion studies in the last 12 months is 0 .

COMPARISONS

None available

FINDINGS

Cortical atrophy is evident with proportional enlargement of the ventricular system. Minimal small
vessel ischemic disease is evident throughout the brain. There is calcification of the cavernous c
arotid arteries. Air-fluid levels noted within the left maxillary sinus with associated mucosal
thickening of the right maxillary sinus and ethmoid air cells. Symptoms of sinusitis should be
excluded clinically. Bony calvarium is unremarkable.

IMPRESSION

No intracranial mass or hemorrhage. Diffuse cortical atrophy is evident.

Extensive inflammatory changes of the paranasal sinuses. Report called to Dr. Etienne at [DATE] a.m..

Tech Notes:

PT STATES AMS THIS MORNING. CT/NM 0/0. TJ

## 2023-03-21 IMAGING — CR [ID]
2 series · 2 of 2 positions shown · non-contrast
Comparison: none

[x chest ap (1 of 2)]
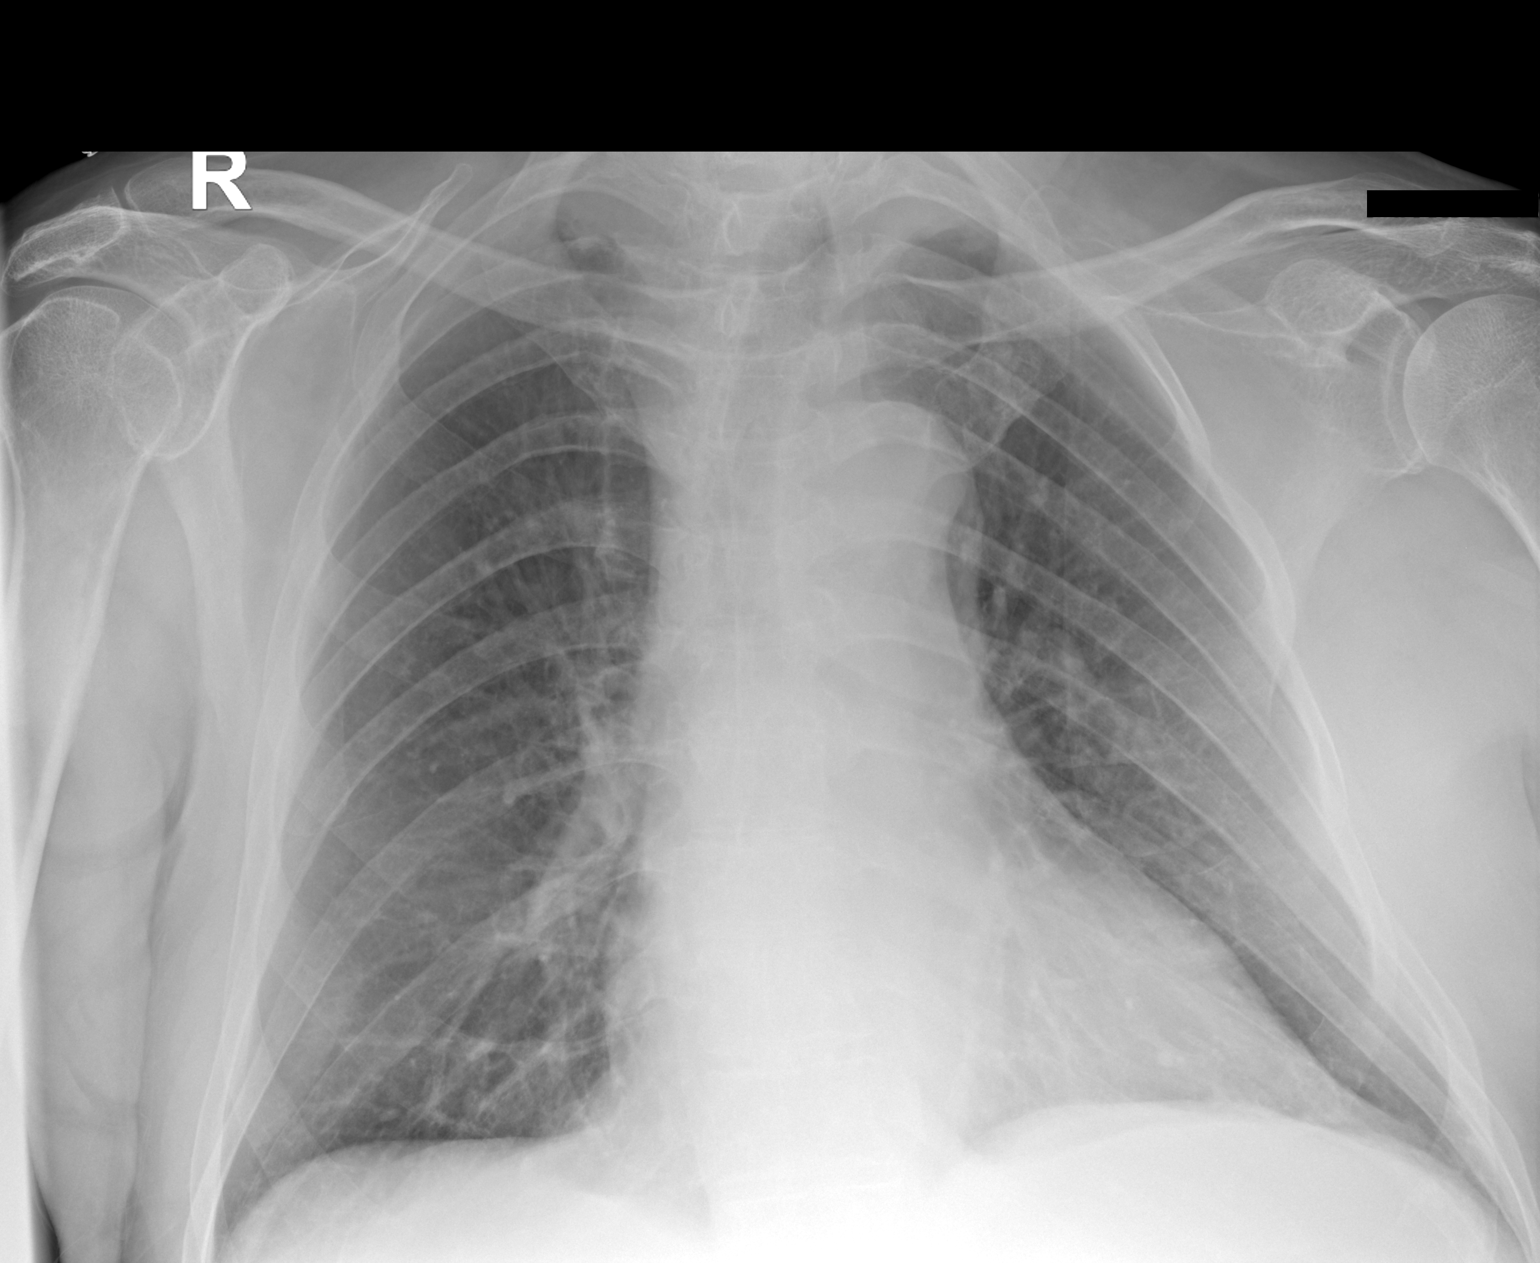

[x chest ap (2 of 2)]
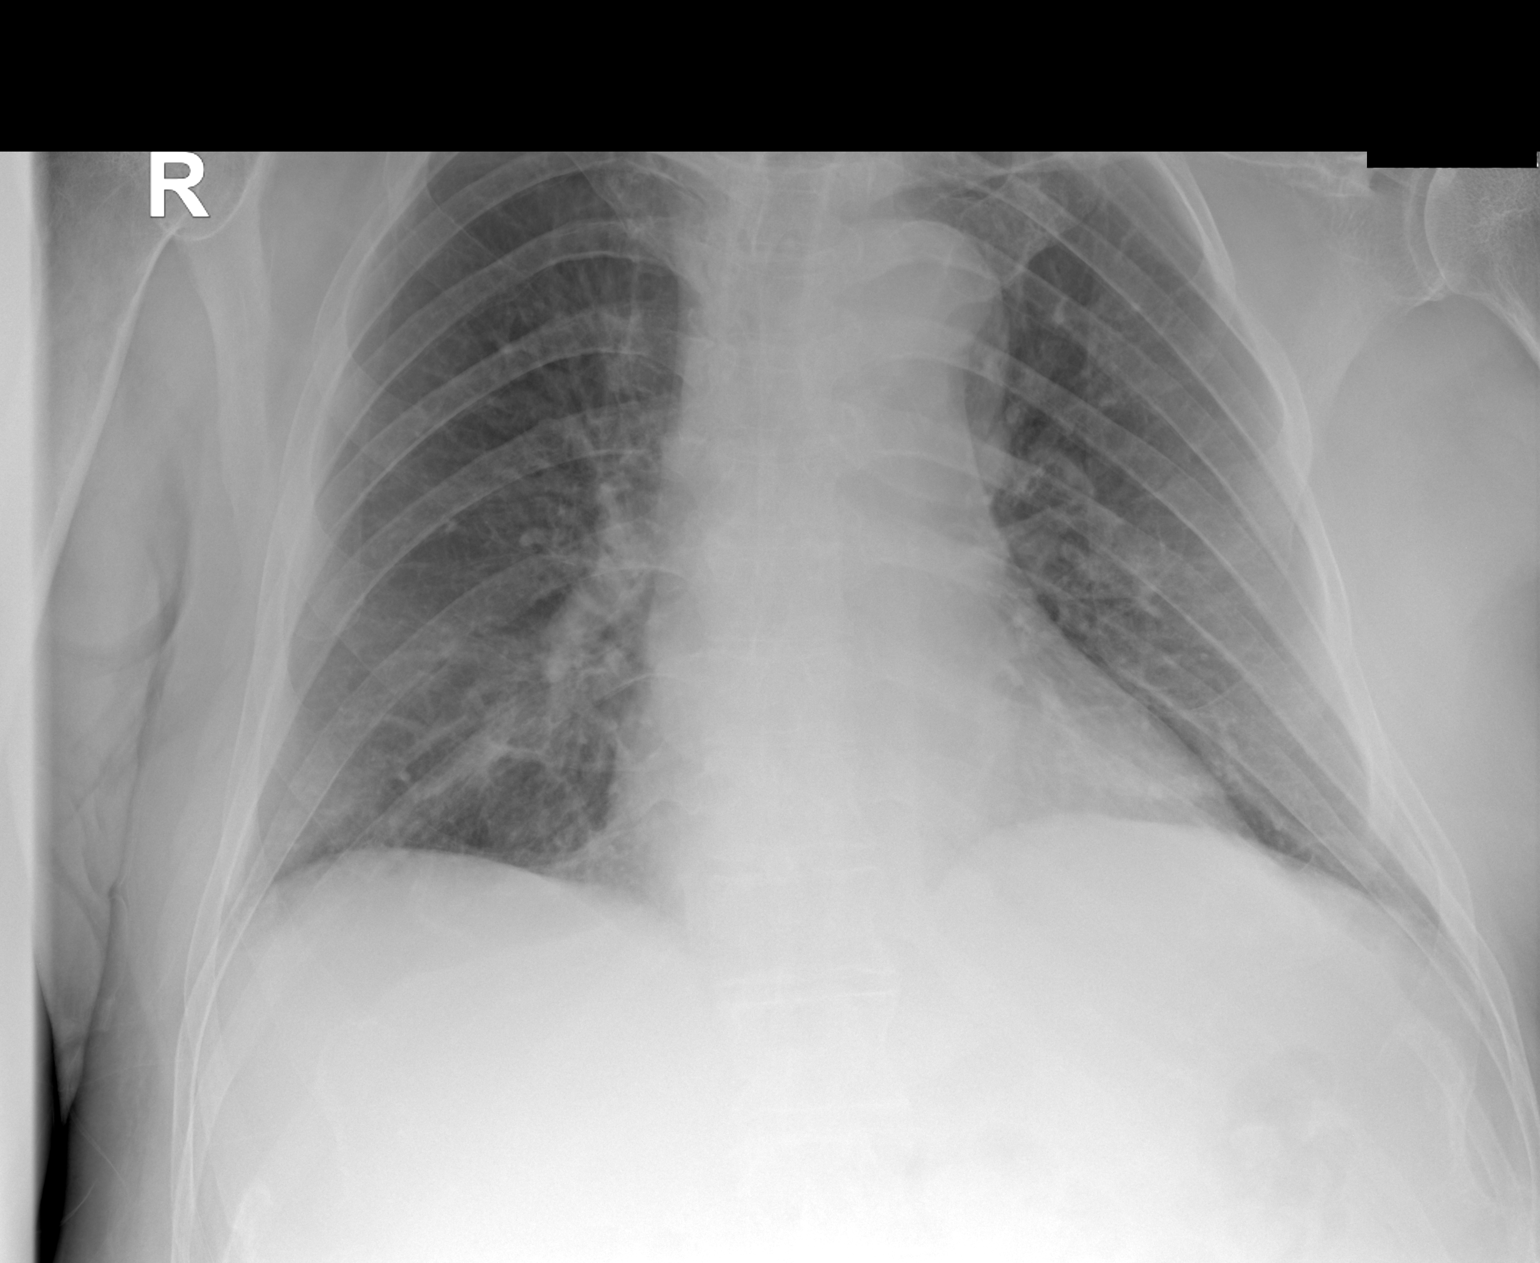

[2 of 2 positions shown; findings below may reference images not displayed]

DIAGNOSTIC STUDIES

EXAM

XR chest 1V

INDICATION

Pneumonia
follow up ,pneumonia

TECHNIQUE

Single view

COMPARISONS

Prior day

FINDINGS

No focal pneumonia or pneumothorax. The heart size is normal. No acute osseous findings.

IMPRESSION

No dense consolidation.

Tech Notes:

follow up ,pneumonia

## 2024-01-22 ENCOUNTER — Encounter: Admit: 2024-01-22 | Discharge: 2024-01-22 | Payer: MEDICARE

## 2024-01-22 DIAGNOSIS — I4892 Unspecified atrial flutter: Secondary | ICD-10-CM

## 2024-01-22 DIAGNOSIS — Z7901 Long term (current) use of anticoagulants: Secondary | ICD-10-CM

## 2024-01-22 DIAGNOSIS — I480 Paroxysmal atrial fibrillation: Secondary | ICD-10-CM

## 2024-01-22 MED ORDER — FLECAINIDE 50 MG PO TAB
50 mg | ORAL_TABLET | Freq: Two times a day (BID) | ORAL | 3 refills | 30.00000 days | Status: AC
Start: 2024-01-22 — End: ?

## 2024-01-22 MED ORDER — METOPROLOL SUCCINATE 25 MG PO TB24
25 mg | ORAL_TABLET | Freq: Every evening | ORAL | 3 refills | 90.00000 days | Status: AC
Start: 2024-01-22 — End: ?

## 2024-01-22 MED ORDER — APIXABAN 5 MG PO TAB
5 mg | ORAL_TABLET | Freq: Two times a day (BID) | ORAL | 3 refills | 30.00000 days | Status: AC
Start: 2024-01-22 — End: ?

## 2024-01-28 ENCOUNTER — Encounter: Admit: 2024-01-28 | Discharge: 2024-01-28 | Payer: MEDICARE

## 2024-01-29 ENCOUNTER — Encounter: Admit: 2024-01-29 | Discharge: 2024-01-29 | Payer: MEDICARE
